# Patient Record
Sex: Male | Born: 1970 | Race: White | Hispanic: No | Marital: Married | Smoking: Never smoker
Health system: Southern US, Community
[De-identification: ages and names within clinical notes are randomized; demographics above are authoritative.]

## PROBLEM LIST (undated history)

## (undated) DIAGNOSIS — F419 Anxiety disorder, unspecified: Secondary | ICD-10-CM

## (undated) DIAGNOSIS — R112 Nausea with vomiting, unspecified: Secondary | ICD-10-CM

## (undated) DIAGNOSIS — E785 Hyperlipidemia, unspecified: Secondary | ICD-10-CM

## (undated) DIAGNOSIS — Z9889 Other specified postprocedural states: Secondary | ICD-10-CM

## (undated) DIAGNOSIS — J302 Other seasonal allergic rhinitis: Secondary | ICD-10-CM

## (undated) HISTORY — DX: Other specified postprocedural states: Z98.890

## (undated) HISTORY — DX: Anxiety disorder, unspecified: F41.9

## (undated) HISTORY — DX: Other seasonal allergic rhinitis: J30.2

## (undated) HISTORY — DX: Hyperlipidemia, unspecified: E78.5

## (undated) HISTORY — DX: Nausea with vomiting, unspecified: R11.2

---
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
## 2008-03-26 ENCOUNTER — Emergency Department (HOSPITAL_COMMUNITY): Admission: EM | Admit: 2008-03-26 | Discharge: 2008-03-26 | Payer: Self-pay | Admitting: Emergency Medicine

## 2009-06-12 IMAGING — CT CT PELVIS W/O CM
1 of 2 series · 15 of 32 positions shown, 19 images · non-contrast
Comparison: None.

CT ABDOMEN

CLINICAL DATA: Right flank pain and right scrotal pain since early
this morning.

CT OF THE ABDOMEN AND PELVIS WITHOUT CONTRAST (CT UROGRAM)
03/26/2008:
TECHNIQUE: Multidetector CT imaging was performed through the
abdomen and pelvis to include the urinary tract. Low dose technique
utilized.

[Series 2: 160 stone 5.0 b40f st · axial · 0.72mm/px · z∈[-217,+143]mm · 15 of 82 slices shown, 19 images]
[im 7/82  soft-tissue]
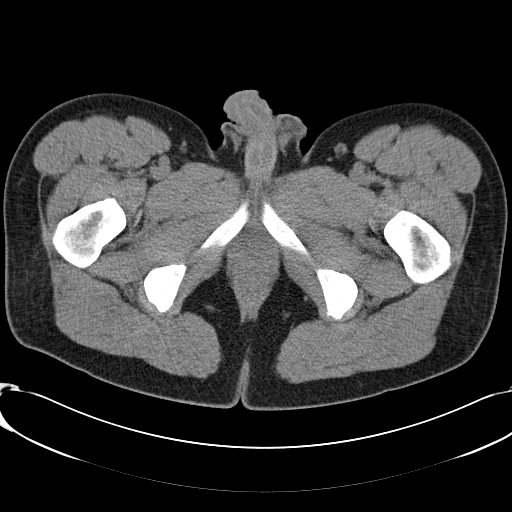
[im 7/82  bone]
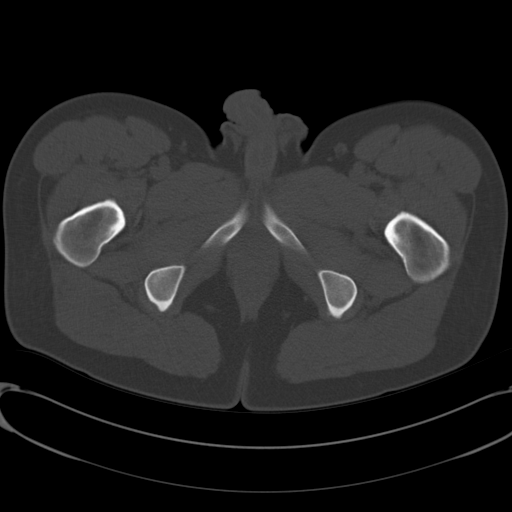
[im 13/82  soft-tissue]
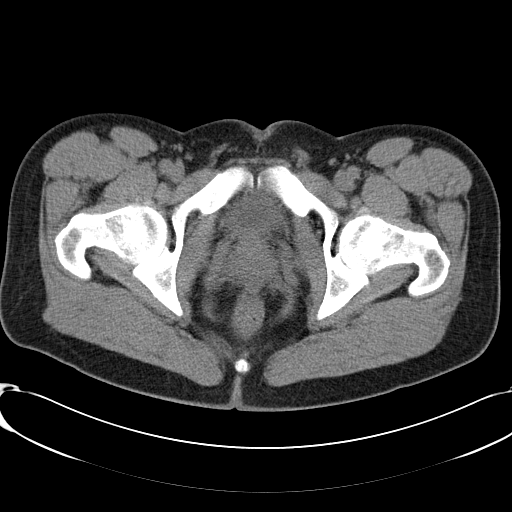
[im 19/82  soft-tissue]
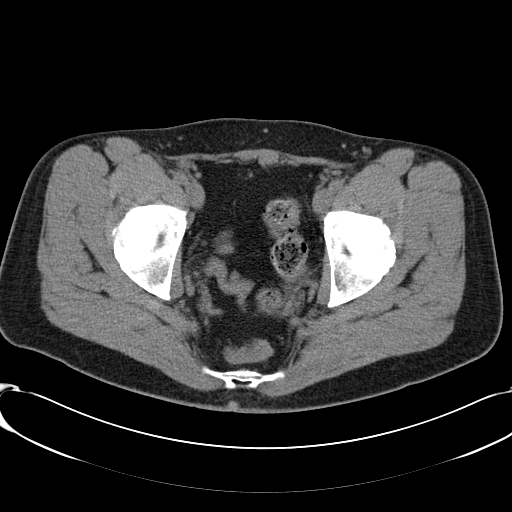
[im 25/82  soft-tissue]
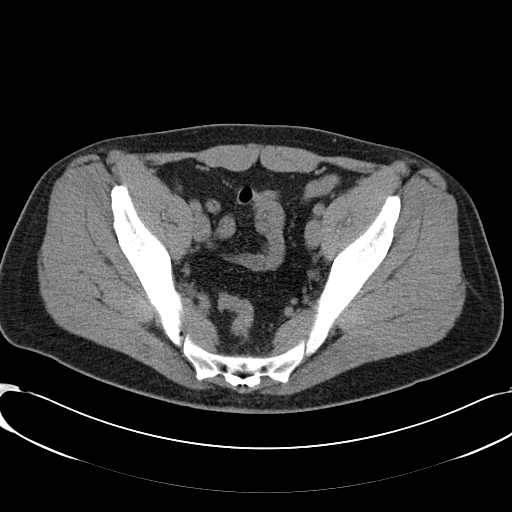
[im 31/82  soft-tissue]
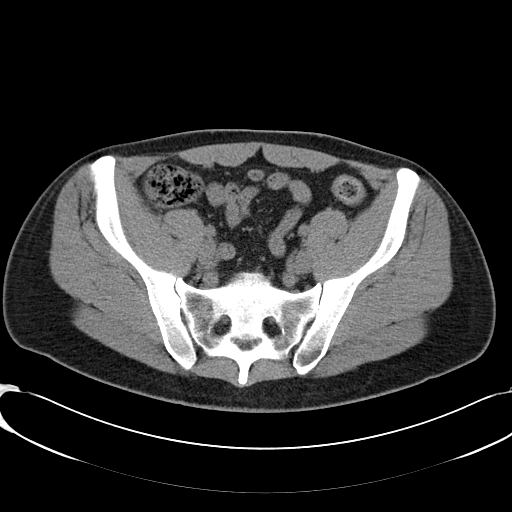
[im 37/82  soft-tissue]
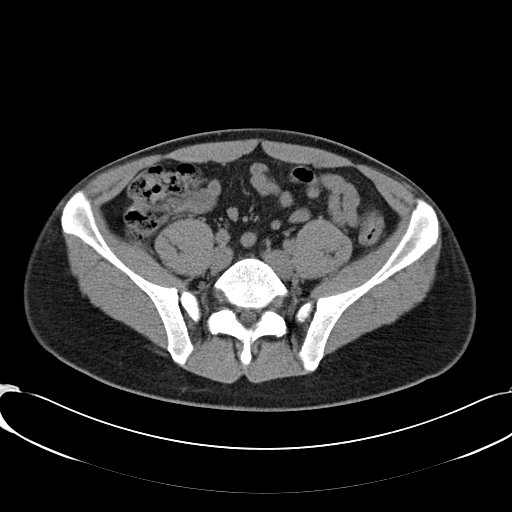
[im 43/82  soft-tissue]
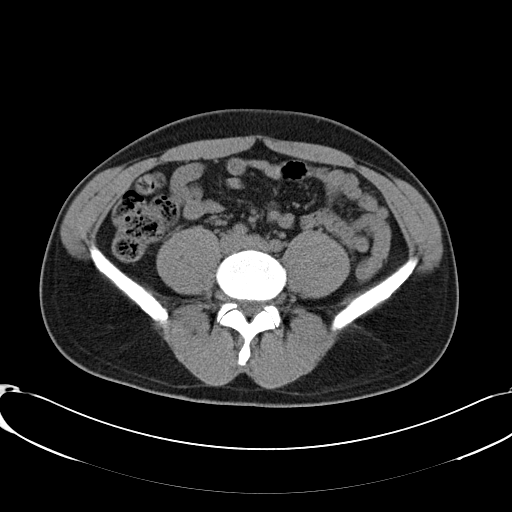
[im 49/82  soft-tissue]
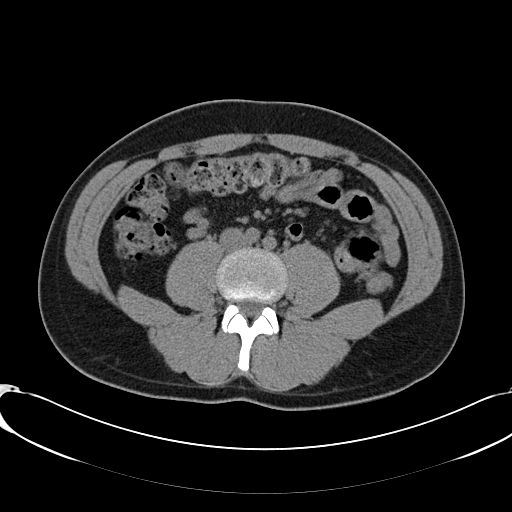
[im 55/82  soft-tissue]
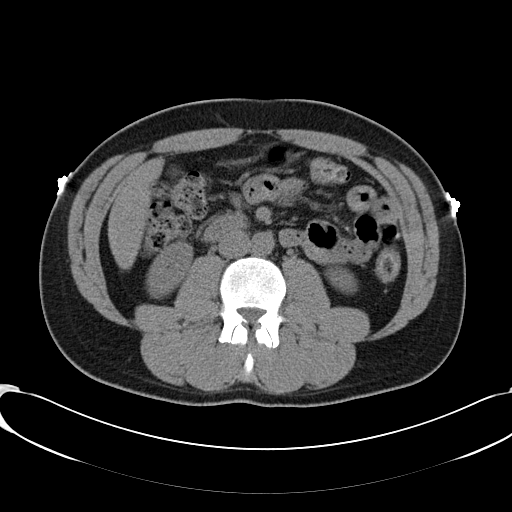
[im 55/82  bone]
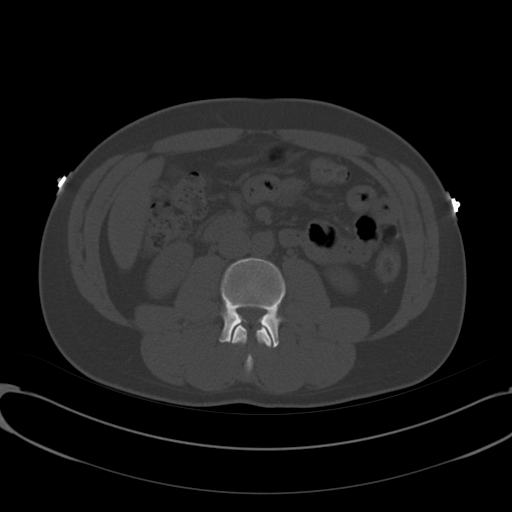
[im 61/82  soft-tissue]
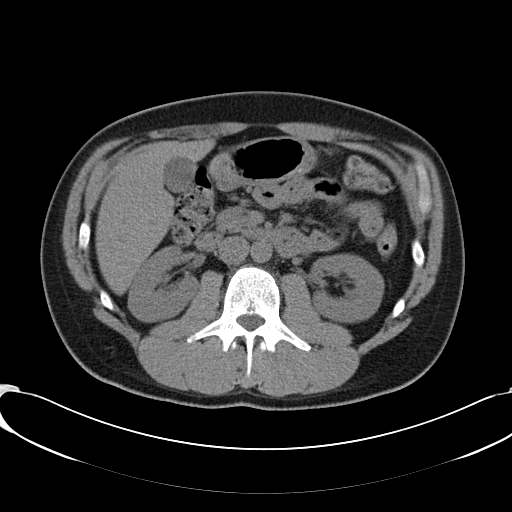
[im 67/82  soft-tissue]
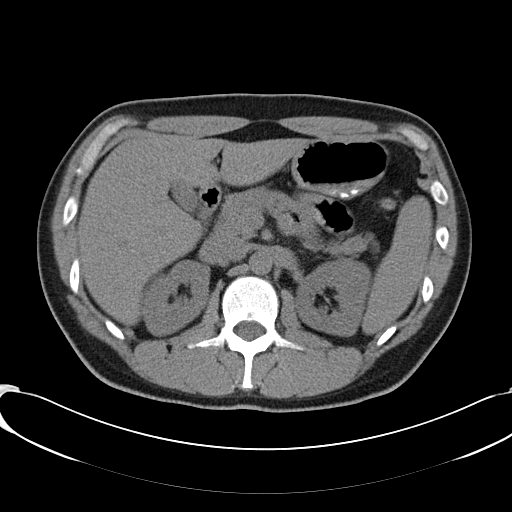
[im 70/82  lung]
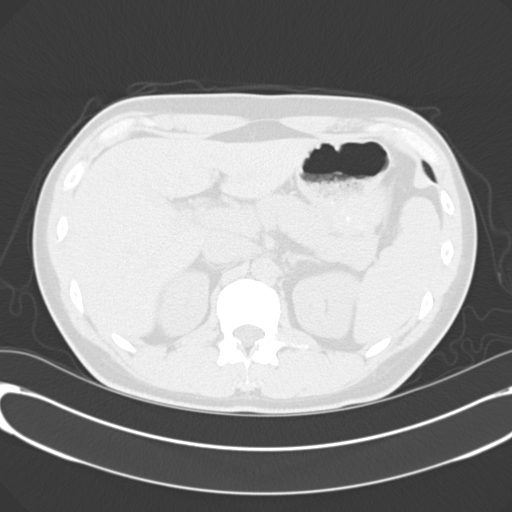
[im 73/82  soft-tissue]
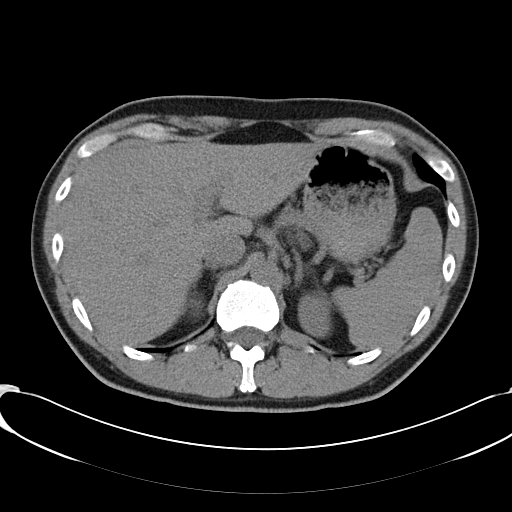
[im 73/82  lung]
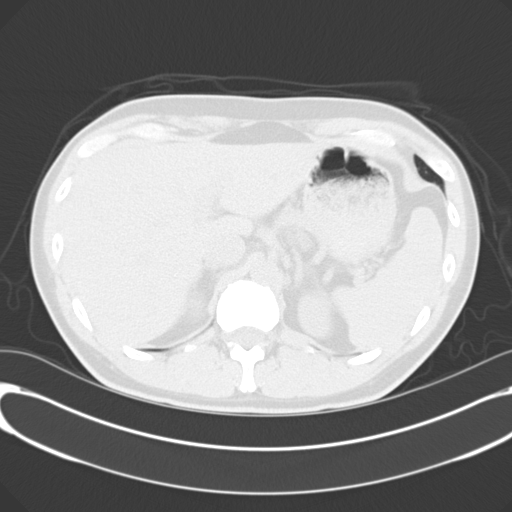
[im 76/82  lung]
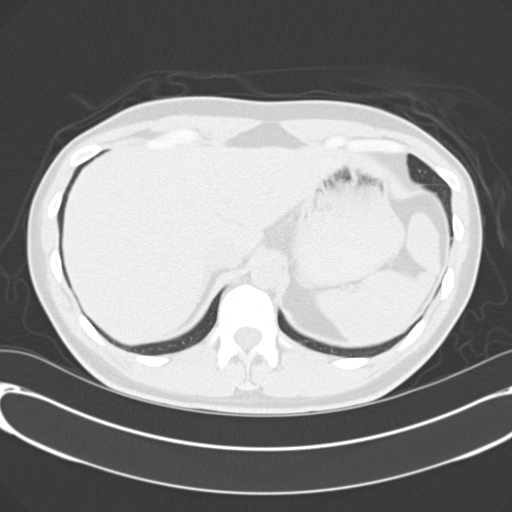
[im 79/82  soft-tissue]
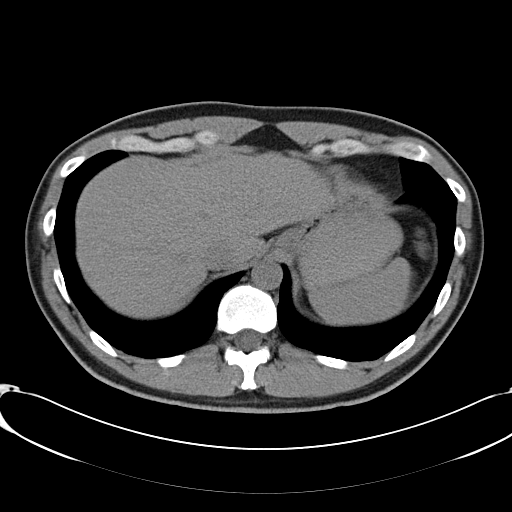
[im 79/82  lung]
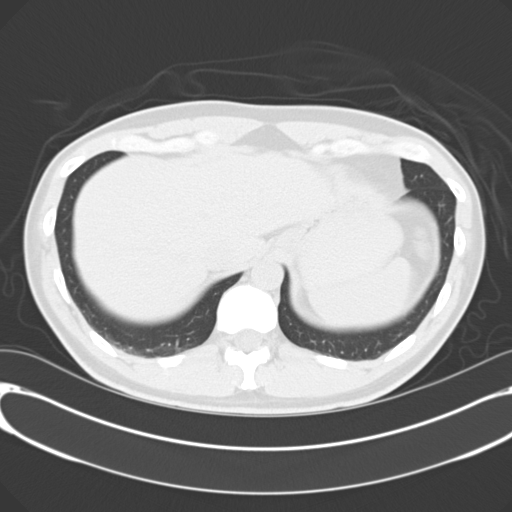

[15 of 32 positions shown; findings below may reference images not displayed]

FINDINGS: No intrarenal or proximal ureteral calculi on either
side.  No evidence of hydronephrosis or other secondary signs of
upper urinary tract obstruction.  Within the limits of the
unenhanced technique, no focal parenchymal abnormalities involving
either kidney.

Normal unenhanced appearance to the visualized liver, spleen,
pancreas, and adrenal glands.  Gallbladder unremarkable by CT.  No
biliary ductal dilation.  Stomach and visualized small bowel and
colon unremarkable.  No ascites.  No significant lymphadenopathy.
No abdominal aortic atherosclerotic calcification.  Visualized
extreme lung bases clear.  Bone window images unremarkable.
IMPRESSION: 1.  No evidence of upper urinary tract calculi or obstruction.
2.  Normal unenhanced CT of the abdomen.

CT PELVIS
FINDINGS: Tiny (approximate 2 mm) calculus at the right UVJ.  No
ureteral dilation.  No left-sided calculi.  Urinary bladder
decompressed.  Prostate gland and seminal vesicles normal for age.
Visualized colon small bowel unremarkable.  Normal appendix in the
right mid pelvis.  No ascites.  No significant lymphadenopathy.
Bone window images unremarkable.
IMPRESSION: 1.  Non-obstructing approximate 2 mm right UVJ calculus.
2.  Otherwise normal unenhanced CT of the pelvis.

## 2011-03-10 LAB — URINALYSIS, ROUTINE W REFLEX MICROSCOPIC
Glucose, UA: NEGATIVE
Nitrite: NEGATIVE
pH: 7

## 2011-03-10 LAB — URINE MICROSCOPIC-ADD ON

## 2017-10-19 DIAGNOSIS — Z0000 Encounter for general adult medical examination without abnormal findings: Secondary | ICD-10-CM | POA: Diagnosis not present

## 2017-11-02 DIAGNOSIS — E7800 Pure hypercholesterolemia, unspecified: Secondary | ICD-10-CM | POA: Diagnosis not present

## 2017-12-30 DIAGNOSIS — Z23 Encounter for immunization: Secondary | ICD-10-CM | POA: Diagnosis not present

## 2017-12-30 DIAGNOSIS — J302 Other seasonal allergic rhinitis: Secondary | ICD-10-CM | POA: Diagnosis not present

## 2017-12-30 DIAGNOSIS — E7800 Pure hypercholesterolemia, unspecified: Secondary | ICD-10-CM | POA: Diagnosis not present

## 2017-12-30 DIAGNOSIS — Z87442 Personal history of urinary calculi: Secondary | ICD-10-CM | POA: Diagnosis not present

## 2021-07-07 ENCOUNTER — Encounter: Payer: Self-pay | Admitting: Gastroenterology

## 2021-07-29 ENCOUNTER — Ambulatory Visit (AMBULATORY_SURGERY_CENTER): Payer: Self-pay

## 2021-07-29 ENCOUNTER — Other Ambulatory Visit: Payer: Self-pay

## 2021-07-29 VITALS — Ht 69.0 in | Wt 150.0 lb

## 2021-07-29 DIAGNOSIS — Z1211 Encounter for screening for malignant neoplasm of colon: Secondary | ICD-10-CM

## 2021-07-29 MED ORDER — NA SULFATE-K SULFATE-MG SULF 17.5-3.13-1.6 GM/177ML PO SOLN: 1.0000 | Freq: Once | ORAL | 0 refills | Status: AC

## 2021-07-29 NOTE — Progress Notes (Signed)
No egg or soy allergy known to patient  No issues known to pt with past sedation with any surgeries or procedures Patient denies ever being told they had issues or difficulty with intubation  No FH of Malignant Hyperthermia Pt is not on diet pills Pt is not on home 02  Pt is not on blood thinners  Pt denies issues with constipation;  No A fib or A flutter Pt is fully vaccinated for Covid x 2 + boosters; NO PA's for preps discussed with pt in PV today  Discussed with pt there will be an out-of-pocket cost for prep and that varies from $0 to 70 + dollars - pt verbalized understanding  Due to the COVID-19 pandemic we are asking patients to follow certain guidelines in PV and the [place]   Pt aware of COVID protocols and [facility] guidelines  PV completed over the phone. Pt verified name, DOB, address and insurance during PV today.  Pt mailed instruction packet with copy of consent form to read and not return, and instructions.  Pt encouraged to call with questions or issues.  If pt has My chart, procedure instructions sent via My Chart

## 2021-08-08 ENCOUNTER — Encounter: Payer: Self-pay | Admitting: Gastroenterology

## 2021-08-12 ENCOUNTER — Ambulatory Visit (AMBULATORY_SURGERY_CENTER): Payer: 59 | Admitting: Gastroenterology

## 2021-08-12 ENCOUNTER — Encounter: Payer: Self-pay | Admitting: Gastroenterology

## 2021-08-12 VITALS — BP 105/66 | HR 52 | Temp 97.8°F | Resp 12 | Ht 69.0 in | Wt 150.0 lb

## 2021-08-12 DIAGNOSIS — D125 Benign neoplasm of sigmoid colon: Secondary | ICD-10-CM

## 2021-08-12 DIAGNOSIS — Z1211 Encounter for screening for malignant neoplasm of colon: Secondary | ICD-10-CM

## 2021-08-12 DIAGNOSIS — K635 Polyp of colon: Secondary | ICD-10-CM | POA: Diagnosis not present

## 2021-08-12 DIAGNOSIS — K641 Second degree hemorrhoids: Secondary | ICD-10-CM

## 2021-08-12 MED ORDER — SODIUM CHLORIDE 0.9 % IV SOLN: 500.0000 mL | Freq: Once | INTRAVENOUS | Status: DC

## 2021-08-12 NOTE — Op Note (Signed)
[facility] ?Patient Name: Estefan Pattison ?Procedure Date: 08/12/2021 10:27 AM ?MRN: [id number] ?Endoscopist: Gerrit Heck , MD ?Age: 51 ?Referring MD:  ?Date of Birth: 1970/07/17 ?Gender: Male ?Account #: [id number] ?Procedure:                Colonoscopy ?Indications:              Screening for colorectal malignant neoplasm, This  ?                          is the patient's first colonoscopy ?Medicines:                Monitored Anesthesia Care ?Procedure:                Pre-Anesthesia Assessment: ?                          - Prior to the procedure, a History and Physical  ?                          was performed, and patient medications and  ?                          allergies were reviewed. The patient's tolerance of  ?                          previous anesthesia was also reviewed. The risks  ?                          and benefits of the procedure and the sedation  ?                          options and risks were discussed with the patient.  ?                          All questions were answered, and informed consent  ?                          was obtained. Prior Anticoagulants: The patient has  ?                          taken no previous anticoagulant or antiplatelet  ?                          agents. ASA Grade Assessment: I - A normal, healthy  ?                          patient. After reviewing the risks and benefits,  ?                          the patient was deemed in satisfactory condition to  ?                          undergo the procedure. ?  After obtaining informed consent, the colonoscope  ?                          was passed under direct vision. Throughout the  ?                          procedure, the patient's blood pressure, pulse, and  ?                          oxygen saturations were monitored continuously. The  ?                          Olympus CF-HQ190L (Serial# [id number]) Colonoscope was  ?                          introduced through the anus and  advanced to the the  ?                          terminal ileum. The colonoscopy was performed  ?                          without difficulty. The patient tolerated the  ?                          procedure well. The quality of the bowel  ?                          preparation was excellent. The terminal ileum,  ?                          ileocecal valve, appendiceal orifice, and rectum  ?                          were photographed. ?Scope In: 10:34:26 AM ?Scope Out: 10:50:35 AM ?Scope Withdrawal Time: 0 hours 10 minutes 17 seconds  ?Total Procedure Duration: 0 hours 16 minutes 9 seconds  ?Findings:                 The perianal and digital rectal examinations were  ?                          normal. ?                          Two sessile polyps were found in the sigmoid colon.  ?                          The polyps were 2 to 4 mm in size. These polyps  ?                          were removed with a cold snare. Resection and  ?                          retrieval were complete. Estimated blood loss was  ?  minimal. ?                          Non-bleeding internal hemorrhoids were found during  ?                          retroflexion. The hemorrhoids were small. ?                          The terminal ileum appeared normal. ?Complications:            No immediate complications. ?Estimated Blood Loss:     Estimated blood loss was minimal. ?Impression:               - Two 2 to 4 mm polyps in the sigmoid colon,  ?                          removed with a cold snare. Resected and retrieved. ?                          - Non-bleeding internal hemorrhoids. ?                          - The examined portion of the ileum was normal. ?Recommendation:           - Patient has a contact number available for  ?                          emergencies. The signs and symptoms of potential  ?                          delayed complications were discussed with the  ?                          patient. Return to normal  activities tomorrow.  ?                          Written discharge instructions were provided to the  ?                          patient. ?                          - Resume previous diet. ?                          - Continue present medications. ?                          - Await pathology results. ?                          - Repeat colonoscopy for surveillance based on  ?                          pathology results. ?                          -  Return to GI office PRN. ?                          - Use fiber, for example Citrucel, Fibercon, Konsyl  ?                          or Metamucil. ?                          - Internal hemorrhoids were noted on this study and  ?                          may be amenable to hemorrhoid band ligation. If you  ?                          are interested in further treatment of these  ?                          hemorrhoids with band ligation, please contact my  ?                          clinic to set up an appointment for evaluation and  ?                          treatment. ?Gerrit Heck, MD ?08/12/2021 10:58:29 AM ?

## 2021-08-12 NOTE — Progress Notes (Signed)
Pt's states no medical or surgical changes since previsit or office visit. 

## 2021-08-12 NOTE — Progress Notes (Signed)
? ?GASTROENTEROLOGY PROCEDURE H&P NOTE  ? ?Primary Care Physician: ?Haywood Pao, MD ? ? ? ?Reason for Procedure:  Colon Cancer screening ? ?Plan:    Colonoscopy ? ?Patient is appropriate for endoscopic procedure(s) in the ambulatory ([facility]) setting. ? ?The nature of the procedure, as well as the risks, benefits, and alternatives were carefully and thoroughly reviewed with the patient. Ample time for discussion and questions allowed. The patient understood, was satisfied, and agreed to proceed.  ? ? ? ?HPI: ?Shawn Duran is a 51 y.o. male who presents for colonoscopy for routine Colon Cancer screening.  No active GI symptoms.  No known family history of colon cancer or related malignancy.  Patient is otherwise without complaints or active issues today. ? ?Past Medical History:  ?Diagnosis Date  ? Anxiety   ? no meds  ? Hyperlipidemia   ? on OTC meds  ? PONV (postoperative nausea and vomiting)   ? Seasonal allergies   ? ? ?Past Surgical History:  ?Procedure Laterality Date  ? THYROID CYST EXCISION  1987  ? ? ?Prior to Admission medications   ?Medication Sig Start Date End Date Taking? Authorizing Provider  ?Omega-3 Fatty Acids (FISH OIL PO) Take 1,200 mg by mouth daily at 6 (six) AM.   Yes [provider]  ? ? ?Current Outpatient Medications  ?Medication Sig Dispense Refill  ? Omega-3 Fatty Acids (FISH OIL PO) Take 1,200 mg by mouth daily at 6 (six) AM.    ? ?Current Facility-Administered Medications  ?Medication Dose Route Frequency Provider Last Rate Last Admin  ? 0.9 %  sodium chloride infusion  500 mL Intravenous Once Doninique Lwin V, DO      ? ? ?Allergies as of 08/12/2021 - Review Complete 08/12/2021  ?Allergen Reaction Noted  ? Penicillins Hives and Rash 07/29/2021  ? ? ?Family History  ?Problem Relation Age of Onset  ? Colon polyps Neg Hx   ? Colon cancer Neg Hx   ? Esophageal cancer Neg Hx   ? Rectal cancer Neg Hx   ? Stomach cancer Neg Hx   ? ? ?Social History  ? ?Socioeconomic History   ? Marital status: Married  ?  Spouse name: Not on file  ? Number of children: Not on file  ? Years of education: Not on file  ? Highest education level: Not on file  ?Occupational History  ? Not on file  ?Tobacco Use  ? Smoking status: Never  ? Smokeless tobacco: Never  ?Vaping Use  ? Vaping Use: Never used  ?Substance and Sexual Activity  ? Alcohol use: Yes  ? Drug use: Never  ? Sexual activity: Not on file  ?Other Topics Concern  ? Not on file  ?Social History Narrative  ? Not on file  ? ?Social Determinants of Health  ? ?Financial Resource Strain: Not on file  ?Food Insecurity: Not on file  ?Transportation Needs: Not on file  ?Physical Activity: Not on file  ?Stress: Not on file  ?Social Connections: Not on file  ?Intimate Partner Violence: Not on file  ? ? ?Physical Exam: ?Vital signs in last 24 hours: ?'@BP'$  104/76   Pulse 70   Temp 97.8 ?F (36.6 ?C)   Ht '5\' 9"'$  (1.753 m)   Wt 150 lb (68 kg)   SpO2 98%   BMI 22.15 kg/m?  ?GEN: NAD ?EYE: Sclerae anicteric ?ENT: MMM ?CV: Non-tachycardic ?Pulm: CTA b/l ?GI: Soft, NT/ND ?NEURO:  Alert & Oriented x 3 ? ? ?Gerrit Heck, DO ?[facility] ? ? ?08/12/2021 10:29 AM ? ?

## 2021-08-12 NOTE — Progress Notes (Signed)
Called to room to assist during endoscopic procedure.  Patient ID and intended procedure confirmed with present staff. Received instructions for my participation in the procedure from the performing physician.  

## 2021-08-12 NOTE — Patient Instructions (Signed)
Handouts on polyps and hemorrhoids given to you today  ?Await pathology results  ?Fiber supplement recommended  ? ? ?YOU HAD AN ENDOSCOPIC PROCEDURE TODAY AT [facility]:   Refer to the procedure report that was given to you for any specific questions about what was found during the examination.  If the procedure report does not answer your questions, please call your gastroenterologist to clarify.  If you requested that your care partner not be given the details of your procedure findings, then the procedure report has been included in a sealed envelope for you to review at your convenience later. ? ?YOU SHOULD EXPECT: Some feelings of bloating in the abdomen. Passage of more gas than usual.  Walking can help get rid of the air that was put into your GI tract during the procedure and reduce the bloating. If you had a lower endoscopy (such as a colonoscopy or flexible sigmoidoscopy) you may notice spotting of blood in your stool or on the toilet paper. If you underwent a bowel prep for your procedure, you may not have a normal bowel movement for a few days. ? ?Please Note:  You might notice some irritation and congestion in your nose or some drainage.  This is from the oxygen used during your procedure.  There is no need for concern and it should clear up in a day or so. ? ?SYMPTOMS TO REPORT IMMEDIATELY: ? ?Following lower endoscopy (colonoscopy or flexible sigmoidoscopy): ? Excessive amounts of blood in the stool ? Significant tenderness or worsening of abdominal pains ? Swelling of the abdomen that is new, acute ? Fever of 100?F or higher ? ?For urgent or emergent issues, a gastroenterologist can be reached at any hour by calling [phone]. ?Do not use MyChart messaging for urgent concerns.  ? ? ?DIET:  We do recommend a small meal at first, but then you may proceed to your regular diet.  Drink plenty of fluids but you should avoid alcoholic beverages for 24 hours. ? ?ACTIVITY:  You should  plan to take it easy for the rest of today and you should NOT DRIVE or use heavy machinery until tomorrow (because of the sedation medicines used during the test).   ? ?FOLLOW UP: ?Our staff will call the number listed on your records 48-72 hours following your procedure to check on you and address any questions or concerns that you may have regarding the information given to you following your procedure. If we do not reach you, we will leave a message.  We will attempt to reach you two times.  During this call, we will ask if you have developed any symptoms of COVID 19. If you develop any symptoms (ie: fever, flu-like symptoms, shortness of breath, cough etc.) before then, please call [phone].  If you test positive for Covid 19 in the 2 weeks post procedure, please call and report this information to [place].   ? ?If any biopsies were taken you will be contacted by phone or by letter within the next 1-3 weeks.  Please call us at [phone] if you have not heard about the biopsies in 3 weeks.  ? ? ?SIGNATURES/CONFIDENTIALITY: ?You and/or your care partner have signed paperwork which will be entered into your electronic medical record.  These signatures attest to the fact that that the information above on your After Visit Summary has been reviewed and is understood.  Full responsibility of the confidentiality of this discharge information lies with you and/or your care-partner.  ?

## 2021-08-12 NOTE — Progress Notes (Signed)
Report to PACU, RN, vss, BBS= Clear.  

## 2021-08-14 ENCOUNTER — Telehealth: Payer: Self-pay | Admitting: *Deleted

## 2021-08-14 NOTE — Telephone Encounter (Signed)
First follow up call attempt.  LVM. 

## 2021-08-14 NOTE — Telephone Encounter (Signed)
?  Follow up Call- ? ?Call back number 08/12/2021  ?Post procedure Call Back phone  # [phone]  ?Permission to leave phone message Yes  ?Some recent data might be hidden  ?  ? ?Patient questions: ? ?Do you have a fever, pain , or abdominal swelling? No. ?Pain Score  0 * ? ?Have you tolerated food without any problems? Yes.   ? ?Have you been able to return to your normal activities? Yes.   ? ?Do you have any questions about your discharge instructions: ?Diet   No. ?Medications  No. ?Follow up visit  No. ? ?Do you have questions or concerns about your Care? No. ? ?Actions: ?* If pain score is 4 or above: ?No action needed, pain <4. ? ? ?

## 2021-08-20 ENCOUNTER — Encounter: Payer: Self-pay | Admitting: Gastroenterology

## 2022-12-07 ENCOUNTER — Other Ambulatory Visit: Payer: Self-pay | Admitting: Plastic Surgery

## 2023-02-04 ENCOUNTER — Other Ambulatory Visit: Payer: Self-pay | Admitting: Plastic Surgery

## 2023-09-10 ENCOUNTER — Other Ambulatory Visit: Payer: Self-pay | Admitting: Orthopaedic Surgery

## 2023-09-10 DIAGNOSIS — M5416 Radiculopathy, lumbar region: Secondary | ICD-10-CM

## 2023-09-25 ENCOUNTER — Ambulatory Visit
Admission: RE | Admit: 2023-09-25 | Discharge: 2023-09-25 | Disposition: A | Discharge status: Home / Self Care | Source: Ambulatory Visit | Attending: Orthopaedic Surgery | Admitting: Orthopaedic Surgery

## 2023-09-25 DIAGNOSIS — M5416 Radiculopathy, lumbar region: Secondary | ICD-10-CM
